# Patient Record
Sex: Male | Born: 2000 | Hispanic: Yes | Marital: Single | Smoking: Never smoker
Health system: Southern US, Community
[De-identification: ages and names within clinical notes are randomized; demographics above are authoritative.]

## PROBLEM LIST (undated history)

## (undated) DIAGNOSIS — F909 Attention-deficit hyperactivity disorder, unspecified type: Secondary | ICD-10-CM

## (undated) DIAGNOSIS — Z789 Other specified health status: Secondary | ICD-10-CM

## (undated) DIAGNOSIS — F39 Unspecified mood [affective] disorder: Secondary | ICD-10-CM

---
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
## 2020-02-04 ENCOUNTER — Emergency Department
Admission: EM | Admit: 2020-02-04 | Discharge: 2020-02-05 | Disposition: A | Discharge status: Home / Self Care | Payer: Medicaid Other | Attending: Emergency Medicine | Admitting: Emergency Medicine

## 2020-02-04 ENCOUNTER — Other Ambulatory Visit: Payer: Self-pay

## 2020-02-04 ENCOUNTER — Encounter: Payer: Self-pay | Admitting: Emergency Medicine

## 2020-02-04 DIAGNOSIS — F39 Unspecified mood [affective] disorder: Secondary | ICD-10-CM | POA: Diagnosis not present

## 2020-02-04 DIAGNOSIS — Z20822 Contact with and (suspected) exposure to covid-19: Secondary | ICD-10-CM | POA: Insufficient documentation

## 2020-02-04 DIAGNOSIS — F4325 Adjustment disorder with mixed disturbance of emotions and conduct: Secondary | ICD-10-CM | POA: Diagnosis not present

## 2020-02-04 DIAGNOSIS — F909 Attention-deficit hyperactivity disorder, unspecified type: Secondary | ICD-10-CM | POA: Insufficient documentation

## 2020-02-04 DIAGNOSIS — R45851 Suicidal ideations: Secondary | ICD-10-CM | POA: Insufficient documentation

## 2020-02-04 DIAGNOSIS — Z046 Encounter for general psychiatric examination, requested by authority: Secondary | ICD-10-CM | POA: Diagnosis not present

## 2020-02-04 LAB — CBC
HCT: 45.9 % (ref 39.0–52.0)
Hemoglobin: 16.2 g/dL (ref 13.0–17.0)
MCH: 30.7 pg (ref 26.0–34.0)
MCHC: 35.3 g/dL (ref 30.0–36.0)
MCV: 87.1 fL (ref 80.0–100.0)
Platelets: 300 10*3/uL (ref 150–400)
RBC: 5.27 MIL/uL (ref 4.22–5.81)
RDW: 13.7 % (ref 11.5–15.5)
WBC: 8.3 10*3/uL (ref 4.0–10.5)
nRBC: 0.2 % (ref 0.0–0.2)

## 2020-02-04 LAB — COMPREHENSIVE METABOLIC PANEL
ALT: 31 U/L (ref 0–44)
AST: 34 U/L (ref 15–41)
Albumin: 5 g/dL (ref 3.5–5.0)
Alkaline Phosphatase: 102 U/L (ref 38–126)
Anion gap: 12 (ref 5–15)
BUN: 14 mg/dL (ref 6–20)
CO2: 21 mmol/L — ABNORMAL LOW (ref 22–32)
Calcium: 10 mg/dL (ref 8.9–10.3)
Chloride: 106 mmol/L (ref 98–111)
Creatinine, Ser: 0.81 mg/dL (ref 0.61–1.24)
GFR, Estimated: >60 mL/min (ref >60)
Glucose, Bld: 79 mg/dL (ref 70–99)
Potassium: 5 mmol/L (ref 3.5–5.1)
Sodium: 139 mmol/L (ref 135–145)
Total Bilirubin: 2.8 mg/dL — ABNORMAL HIGH (ref 0.3–1.2)
Total Protein: 8.2 g/dL — ABNORMAL HIGH (ref 6.5–8.1)

## 2020-02-04 LAB — URINE DRUG SCREEN, QUALITATIVE (ARMC ONLY)
Amphetamines, Ur Screen: NOT DETECTED
Barbiturates, Ur Screen: NOT DETECTED
Benzodiazepine, Ur Scrn: NOT DETECTED
Cannabinoid 50 Ng, Ur ~~LOC~~: NOT DETECTED
Cocaine Metabolite,Ur ~~LOC~~: NOT DETECTED
MDMA (Ecstasy)Ur Screen: NOT DETECTED
Methadone Scn, Ur: NOT DETECTED
Opiate, Ur Screen: NOT DETECTED
Phencyclidine (PCP) Ur S: NOT DETECTED
Tricyclic, Ur Screen: NOT DETECTED

## 2020-02-04 LAB — ETHANOL: Alcohol, Ethyl (B): <10 mg/dL (ref <10)

## 2020-02-04 LAB — RESPIRATORY PANEL BY RT PCR (FLU A&B, COVID)
Influenza A by PCR: NEGATIVE
Influenza B by PCR: NEGATIVE
SARS Coronavirus 2 by RT PCR: NEGATIVE

## 2020-02-04 LAB — SALICYLATE LEVEL: Salicylate Lvl: <7 mg/dL — ABNORMAL LOW (ref 7.0–30.0)

## 2020-02-04 LAB — ACETAMINOPHEN LEVEL: Acetaminophen (Tylenol), Serum: <10 ug/mL — ABNORMAL LOW (ref 10–30)

## 2020-02-04 NOTE — BH Assessment (Addendum)
Assessment Note  Joshua Weeks is an 19 y.o. male who presents to the ER, via law enforcement, after he was seen at [facility]). Patient states he went to the police station with the intentions of getting arrested, so he can go to jail. "I had a bag of drugs and a knife with me." He further explained, he want "to get locked up so I can get tough or die hard trying." When asked how long he has felt this way and what lead to him wanting to do this?, He was unable to give a time frame but says, "I'm tired of been weak." Per the report of [facility] ([phone]), the patient reported having the plan of ending his life by overdosing on medication, cutting his wrist and or jumping off a bridge. He shared he has had one suicide attempt in the past and it resulted in him getting admitted to the medical floor and when stable, transferred to behavioral medicine.  During the interview, the patient was calm, cooperative and pleasant. He was able to provide appropriate answers to the questions. He denies history of violence and aggression. He also denies involvement with the legal system. He denies the use of mind-altering substances and his labs reflect the same.  Diagnosis: Major Depression  Past Medical History:  Past Medical History:  Diagnosis Date  . ADHD   . Medical history non-contributory   . Mood disorder (HCC)     History reviewed. No pertinent surgical history.  Family History: History reviewed. No pertinent family history.  Social History:  reports that he has never smoked. He has never used smokeless tobacco. He reports that he does not drink alcohol and does not use drugs.  Additional Social History:  Alcohol / Drug Use Pain Medications: See PTA Prescriptions: See PTA Over the Counter: See PTA History of alcohol / drug use?: No history of alcohol / drug abuse Longest period of sobriety (when/how long): None  CIWA: CIWA-Ar BP: 129/80 Pulse Rate: 61 COWS:     Allergies:  Allergies  Allergen Reactions  . Corn Oil Rash  . Lac Bovis Rash  . Soy Allergy Hives  . Wheat Bran Rash    Home Medications: (Not in a hospital admission)   OB/GYN Status:  No LMP for male patient.  General Assessment Data Location of Assessment: [facility] ED TTS Assessment: In system Is this a Tele or Face-to-Face Assessment?: Face-to-Face Is this an Initial Assessment or a Re-assessment for this encounter?: Initial Assessment Patient Accompanied by:: N/A Language Other than English: No Living Arrangements: Other (Comment) (Private Home) What gender do you identify as?: Male Date Telepsych consult ordered in [facility]: 02/04/20 Time Telepsych consult ordered in [facility]: 1642 Marital status: Single Pregnancy Status: No Living Arrangements: Non-relatives/Friends Can pt return to current living arrangement?: Yes Admission Status: Involuntary Petitioner: ED Attending Is patient capable of signing voluntary admission?: No (Under IVC) Referral Source: Self/Family/Friend Insurance type: Medicaid  Medical Screening Exam [facility] Walk-in ONLY) Medical Exam completed: Yes  Crisis Care Plan Living Arrangements: Non-relatives/Friends Legal Guardian: Other: (Self) Name of Psychiatrist: Reports of none Name of Therapist: Reports of none  Education Status Is patient currently in school?: No Is the patient employed, unemployed or receiving disability?: Unemployed  Risk to self with the past 6 months Suicidal Ideation: Yes-Currently Present Has patient been a risk to self within the past 6 months prior to admission? : Yes Suicidal Intent: No Has patient had any suicidal intent within the past 6 months  prior to admission? : No Is patient at risk for suicide?: Yes Suicidal Plan?: Yes-Currently Present Has patient had any suicidal plan within the past 6 months prior to admission? : Yes Specify Current Suicidal Plan: Overdose, cut wrist & jump off a bridge Access to Means:  Yes Specify Access to Suicidal Means: Medications, sharp objects and bridges What has been your use of drugs/alcohol within the last 12 months?: reports of none Previous Attempts/Gestures: Yes How many times?: 1 Other Self Harm Risks: Reports of none Triggers for Past Attempts: None known Intentional Self Injurious Behavior: None Comment - Self Injurious Behavior: Reports of none Family Suicide History: Unknown Recent stressful life event(s): Other (Comment) Persecutory voices/beliefs?: No Depression: Yes Depression Symptoms: Isolating, Feeling worthless/self pity, Loss of interest in usual pleasures Substance abuse history and/or treatment for substance abuse?: No Suicide prevention information given to non-admitted patients: Not applicable  Risk to Others within the past 6 months Homicidal Ideation: No Does patient have any lifetime risk of violence toward others beyond the six months prior to admission? : No Thoughts of Harm to Others: No Current Homicidal Intent: No Current Homicidal Plan: No Access to Homicidal Means: No Identified Victim: Reports of none History of harm to others?: No Assessment of Violence: None Noted Violent Behavior Description: Reports of none Does patient have access to weapons?: No Criminal Charges Pending?: No Does patient have a court date: No Is patient on probation?: No  Psychosis Hallucinations: Auditory, Visual Delusions: None noted  Mental Status Report Appearance/Hygiene: Unremarkable, In scrubs Eye Contact: Good Motor Activity: Freedom of movement, Unremarkable Speech: Logical/coherent, Unremarkable Level of Consciousness: Alert Mood: Pleasant, Sad, Depressed Affect: Sad, Depressed Anxiety Level: Minimal Thought Processes: Coherent, Relevant Judgement: Unimpaired Orientation: Person, Place, Time, Situation, Appropriate for developmental age Obsessive Compulsive Thoughts/Behaviors: Minimal  Cognitive Functioning Concentration:  Normal Memory: Recent Intact, Remote Intact Is patient IDD: No Insight: Fair Impulse Control: Fair Appetite: Good Have you had any weight changes? : No Change Sleep: No Change Total Hours of Sleep: 7 Vegetative Symptoms: None  ADLScreening [facility] Assessment Services) Patient's cognitive ability adequate to safely complete daily activities?: Yes Patient able to express need for assistance with ADLs?: Yes Independently performs ADLs?: Yes (appropriate for developmental age)  Prior Inpatient Therapy Prior Inpatient Therapy: Yes Prior Therapy Dates: 09/2019 & 02/2018 Prior Therapy Facilty/Provider(s): [facility] Reason for Treatment: Major Depression  Prior Outpatient Therapy Prior Outpatient Therapy: No Does patient have an ACCT team?: No Does patient have Intensive In-House Services?  : No Does patient have [facility] services? : No Does patient have P4CC services?: No  ADL Screening (condition at time of admission) Patient's cognitive ability adequate to safely complete daily activities?: Yes Is the patient deaf or have difficulty hearing?: No Does the patient have difficulty seeing, even when wearing glasses/contacts?: No Does the patient have difficulty concentrating, remembering, or making decisions?: No Patient able to express need for assistance with ADLs?: Yes Does the patient have difficulty dressing or bathing?: No Independently performs ADLs?: Yes (appropriate for developmental age) Does the patient have difficulty walking or climbing stairs?: No Weakness of Legs: None Weakness of Arms/Hands: None  Home Assistive Devices/Equipment Home Assistive Devices/Equipment: None  Therapy Consults (therapy consults require a physician order) PT Evaluation Needed: No OT Evalulation Needed: No SLP Evaluation Needed: No Abuse/Neglect Assessment (Assessment to be complete while patient is alone) Abuse/Neglect Assessment Can Be Completed: Yes Physical Abuse: Denies Verbal Abuse:  Denies Sexual Abuse: Denies Exploitation of patient/patient's resources: Denies Self-Neglect: Denies  Values / Beliefs Cultural Requests During Hospitalization: None Spiritual Requests During Hospitalization: None Consults Spiritual Care Consult Needed: No Transition of Care Team Consult Needed: No Advance Directives (For Healthcare) Does Patient Have a Medical Advance Directive?: No Would patient like information on creating a medical advance directive?: No - Patient declined  Disposition:  Disposition Initial Assessment Completed for this Encounter: Yes  On Site Evaluation by:   Reviewed with Physician:    Lilyan Gilford MS, LCAS, [facility], NCC Therapeutic Triage Specialist 02/04/2020 6:32 PM

## 2020-02-04 NOTE — ED Provider Notes (Signed)
[facility] Emergency Department Provider Note   ____________________________________________   First MD Initiated Contact with Patient 02/04/20 1616     (approximate)  I have reviewed the triage vital signs and the nursing notes.   HISTORY  Chief Complaint Suicidal Ideation   HPI Joshua Weeks is a 19 y.o. male with possible history of ADHD and mood disorder who presents to the ED for suicidal ideation.  Patient initially presented to the police station, where he stated that he wanted to go to jail in order to toughen himself up.  He states that he does not know how he ended up here because his intention is still to end up in jail.  He states he will go to jail "or I will die trying."  He does endorse suicidal ideation, states he wants to end his life but does not specify how he is thought about doing it.  He denies any alcohol or drug abuse, does not have any medical complaints at this time.        Past Medical History:  Diagnosis Date  . ADHD   . Medical history non-contributory   . Mood disorder [facility])     Patient Active Problem List   Diagnosis Date Noted  . Attention deficit hyperactivity disorder (ADHD), combined type   . DMDD (disruptive mood dysregulation disorder) (HCC) 02/25/2018  . 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 Suicide attempt (HCC) 02/25/2018    History reviewed. No pertinent surgical history.  Prior to Admission medications   Not on File    Allergies Corn oil, Lac bovis, Soy allergy, and Wheat bran  History reviewed. No pertinent family history.  Social History Social History   Tobacco Use  . Smoking status: Never Smoker  . Smokeless tobacco: Never Used  Substance Use Topics  . Alcohol use: No  . Drug use: No    Review of Systems  Constitutional: No fever/chills Eyes: No visual changes. ENT: No sore throat. Cardiovascular: Denies chest pain. Respiratory: Denies shortness of breath. Gastrointestinal:  No abdominal pain.  No nausea, no vomiting.  No diarrhea.  No constipation. Genitourinary: Negative for dysuria. Musculoskeletal: Negative for back pain. Skin: Negative for rash. Neurological: Negative for headaches, focal weakness or numbness.  Positive for suicidal ideation.  ____________________________________________   PHYSICAL EXAM:  VITAL SIGNS: ED Triage Vitals [02/04/20 1542]  Enc Vitals Group     BP 129/80     Pulse Rate 61     Resp 18     Temp 97.7 F (36.5 C)     Temp Source Oral     SpO2 100 %     Weight 225 lb (102.1 kg)     Height 5\' 10"  (1.778 m)     Head Circumference      Peak Flow      Pain Score 0     Pain Loc      Pain Edu?      Excl. in GC?     Constitutional: Alert and oriented. Eyes: Conjunctivae are normal. Head: Atraumatic. Nose: No congestion/rhinnorhea. Mouth/Throat: Mucous membranes are moist. Neck: Normal ROM Cardiovascular: Normal rate, regular rhythm. Grossly normal heart sounds. Respiratory: Normal respiratory effort.  No retractions. Lungs CTAB. Gastrointestinal: Soft and nontender. No distention. Genitourinary: deferred Musculoskeletal: No lower extremity tenderness nor edema. Neurologic:  Normal speech and language. No gross focal neurologic deficits are appreciated. Skin:  Skin is warm, dry and intact. No rash noted. Psychiatric: Mood and affect are normal. Speech and behavior  are normal.  ____________________________________________   LABS (all labs ordered are listed, but only abnormal results are displayed)  Labs Reviewed  COMPREHENSIVE METABOLIC PANEL - Abnormal; Notable for the following components:      Result Value   CO2 21 (*)    Total Protein 8.2 (*)    Total Bilirubin 2.8 (*)    All other components within normal limits  RESPIRATORY PANEL BY RT PCR (FLU A&B, COVID)  CBC  URINE DRUG SCREEN, QUALITATIVE ([facility] ONLY)  ETHANOL  SALICYLATE LEVEL  ACETAMINOPHEN LEVEL    PROCEDURES  Procedure(s) performed  (including Critical Care):  Procedures   ____________________________________________   INITIAL IMPRESSION / ASSESSMENT AND PLAN / ED COURSE       19 year old male with possible history of ADHD and mood disorder who presents to the ED with suicidal ideation, but does not voice any specific plan.  He does have a history of suicidal attempt and is currently under IVC.  He denies any medical complaints and screening labs are unremarkable.  Patient is medically cleared for psychiatric evaluation.      ____________________________________________   FINAL CLINICAL IMPRESSION(S) / ED DIAGNOSES  Final diagnoses:  Suicidal ideation     ED Discharge Orders    None       Note:  This document was prepared using Dragon voice recognition software and may include unintentional dictation errors.   Chesley Noon, MD 02/04/20 [phone]

## 2020-02-04 NOTE — ED Notes (Signed)
Hourly rounding reveals patient in room. No complaints, stable, in no acute distress. Q15 minute rounds and monitoring via Security Cameras to continue. 

## 2020-02-04 NOTE — ED Notes (Signed)
Report to include Situation, Background, Assessment, and Recommendations received from [facility]. Patient alert and oriented, warm and dry, in no acute distress. Patient reported SI without a plan. Denied HI, AVH and pain. Patient made aware of Q15 minute rounds and security cameras for their safety. Patient instructed to come to me with needs or concerns.

## 2020-02-04 NOTE — ED Notes (Signed)
Pt changed into burgundy scrubs °

## 2020-02-04 NOTE — ED Notes (Signed)
Patient transferred via w/c to [facility], room 2, He is calm and cooperative.

## 2020-02-04 NOTE — ED Triage Notes (Addendum)
Pt to ED via [facility] fom [facility]. Pt st "my intention was to go to jail today. I turned in some drugs to the police". Pt st he wanted to end up in jail because he "I wanted to get tougher".   St "I always have suicidal thoughts like jumping off a bridge or jumping into traffic". "best way to go out is get tougher or die trying". Pt st attempting to kill himself by  using a fire extinguisher in 2019 by "spraying it in my mouth" "wanted to die".   Pt st feeling "numb" since 2 weeks ago.   Pt st he is from [place] and recently moved. Lives with friends. Per pt, does not have family.   Denies hallucinations ideation. Auditory hallucinations.   Pt st suicidal attempt in 2019 and admitted to "[facility]".  Pt cooperative, speaking in complete and clear sentences. [facility] in room during triage.

## 2020-02-04 NOTE — ED Notes (Signed)
Snack and beverage given. 

## 2020-02-05 DIAGNOSIS — F4325 Adjustment disorder with mixed disturbance of emotions and conduct: Secondary | ICD-10-CM | POA: Diagnosis present

## 2020-02-05 NOTE — ED Provider Notes (Signed)
Emergency Medicine Observation Re-evaluation Note  Joshua Weeks is a 19 y.o. male, seen on rounds today.  Pt initially presented to the ED for complaints of No chief complaint on file. Currently, the patient is resting comfortably, easily alert, calm and pleasant in his conversation.  Physical Exam  BP 100/62 (BP Location: Left Arm)   Pulse 65   Temp 97.8 F (36.6 C) (Oral)   Resp 18   Ht 5\' 10"  (1.778 m)   Wt 102.1 kg   SpO2 96%   BMI 32.28 kg/m  Physical Exam General: Alert and oriented Cardiac: Normal perfusion, well perfused warm pink skin Lungs: No distress, resting without difficulty speaking full clear sentences Psych: He is calm and pleasant at this time  ED Course / MDM  EKG:    I have reviewed the labs performed to date as well as medications administered while in observation.  Recent changes in the last 24 hours include ongoing observation in the ER, he is calm and appropriate this morning, he is awaiting evaluation by our psychiatry team.  Plan  Current plan is for awaiting psychiatric consult. Patient is under full IVC at this time.   , MD 02/05/20 [phone]

## 2020-02-05 NOTE — ED Provider Notes (Signed)
Case discussed with Nanine Means of psychiatry service.  Patient's IVC has been rescinded and recommendation for discharge.  Patient reports plan to go to homeless shelter[facility].   Sharyn Creamer, MD 02/05/20 1327

## 2020-02-05 NOTE — ED Notes (Signed)
Hourly rounding reveals patient in room. No complaints, stable, in no acute distress. Q15 minute rounds and monitoring via Security Cameras to continue. 

## 2020-02-05 NOTE — Discharge Instructions (Addendum)
[facility] [place] Address: [address] [place], [place], [place] [address] Areas served: [employer] Hours:  Open 24 hours Phone: [phone] Appointments: [email or web address]  Suicidal Feelings: How to Help Yourself Suicide is when you end your own life. There are many things you can do to help yourself feel better when struggling with these feelings. Many services and people are available to support you and others who struggle with similar feelings.  If you ever feel like you may hurt yourself or others, or have thoughts about taking your own life, get help right away. To get help:  Call your local emergency services (911 in the [place]  The [place] [address]'s health and human services helpline (211 in the [place]  Go to your nearest emergency department.  Call a suicide hotline to speak with a trained counselor. The following suicide hotlines are available in the [place]: ? 1-800-273-TALK 3086638480). ? 1-800-SUICIDE 516-604-0363). ? 559-514-9218. This is a hotline for Spanish speakers. ? 629 608 5710. This is a hotline for TTY users. ? 1-866-4-U-TREVOR 902-234-1617). This is a hotline for lesbian, gay, bisexual, transgender, or questioning youth. ? For a list of hotlines in [place], visit ItCheaper.dk.html  Contact a crisis center or a local suicide prevention center. To find a crisis center or suicide prevention center: ? Call your local hospital, clinic, community service organization, mental health center, social service provider, or health department. Ask for help with connecting to a crisis center. ? For a list of crisis centers in the [place], visit: suicidepreventionlifeline.org ? For a list of crisis centers in [place], visit: suicideprevention.ca How to help yourself feel better   Promise yourself that you will not do anything extreme when you have  suicidal feelings. Remember, there is hope. Many people have gotten through suicidal thoughts and feelings, and you can too. If you have had these feelings before, remind yourself that you can get through them again.  Let family, friends, teachers, or counselors know how you are feeling. Try not to separate yourself from those who care about you and want to help you. Talk with someone every day, even if you do not feel sociable. Face-to-face conversation is best to help them understand your feelings.  Contact a mental health care provider and work with this person regularly.  Make a safety plan that you can follow during a crisis. Include phone numbers of suicide prevention hotlines, mental health professionals, and trusted friends and family members you can call during an emergency. Save these numbers on your phone.  If you are thinking of taking a lot of medicine, give your medicine to someone who can give it to you as prescribed. If you are on antidepressants and are concerned you will overdose, tell your health care provider so that he or she can give you safer medicines.  Try to stick to your routines. Follow a schedule every day. Make self-care a priority.  Make a list of realistic goals, and cross them off when you achieve them. Accomplishments can give you a sense of worth.  Wait until you are feeling better before doing things that you find difficult or unpleasant.  Do things that you have always enjoyed to take your mind off your feelings. Try reading a book, or listening to or playing music. Spending time outside, in nature, may help you feel better. Follow these instructions at home:   Visit your primary health care provider every year for a checkup.  Work with a mental  health care provider as needed.  Eat a well-balanced diet, and eat regular meals.  Get plenty of rest.  Exercise if you are able. Just 30 minutes of exercise each day can help you feel better.  Take  over-the-counter and prescription medicines only as told by your health care provider. Ask your mental health care provider about the possible side effects of any medicines you are taking.  Do not use alcohol or drugs, and remove these substances from your home.  Remove weapons, poisons, knives, and other deadly items from your home. General recommendations  Keep your living space well lit.  When you are feeling well, write yourself a letter with tips and support that you can read when you are not feeling well.  Remember that life's difficulties can be sorted out with help. Conditions can be treated, and you can learn behaviors and ways of thinking that will help you. Where to find more information  National Suicide Prevention Lifeline: www.suicidepreventionlifeline.org  Hopeline: www.hopeline.com  [employer] for Suicide Prevention: https://www.ayers.com/  [employer] (for lesbian, gay, bisexual, transgender, or questioning youth): www.thetrevorproject.org Contact a health care provider if:  You feel as though you are a burden to others.  You feel agitated, angry, vengeful, or have extreme mood swings.  You have withdrawn from family and friends. Get help right away if:  You are talking about suicide or wishing to die.  You start making plans for how to commit suicide.  You feel that you have no reason to live.  You start making plans for putting your affairs in order, saying goodbye, or giving your possessions away.  You feel guilt, shame, or unbearable pain, and it seems like there is no way out.  You are frequently using drugs or alcohol.  You are engaging in risky behaviors that could lead to death. If you have any of these symptoms, get help right away. Call emergency services, go to your nearest emergency department or crisis center, or call a suicide crisis helpline. Summary  Suicide is when you take your own life.  Promise yourself that you will not do  anything extreme when you have suicidal feelings.  Let family, friends, teachers, or counselors know how you are feeling.  Get help right away if you feel as though life is getting too tough to handle and you are thinking about suicide. This information is not intended to replace advice given to you by your health care provider. Make sure you discuss any questions you have with your health care provider. Document Revised: 07/15/2018 Document Reviewed: 11/04/2016 Elsevier Patient Education  2020 [employer].

## 2020-02-05 NOTE — BH Assessment (Addendum)
[facility] not accepting pts at this time as of 02/04/20. Follow up needed.  ° °Per [facility] Joann, there is no bed availability due to staffing issues. Joann advised TTS follow up with morning AC to check the status of beds throughout the day as of 02/05/20.  °  °Referral information for Psychiatric Hospitalization faxed to;  °  °· [facility] ([phone]-or- [phone]),  °  °· [facility] (-[phone] -or- [phone]) [phone] °  °· [facility] ([phone]---[phone]---[phone]), °  °· [facility] ([phone], [phone], [phone] or [phone]),  °  °· [facility] ([phone] or [phone]) °  °· [facility] ([phone]),  °  °· [facility] ([phone]). ° °• [facility] ([phone] -or- [phone]),  ° ° °

## 2020-02-05 NOTE — Consult Note (Signed)
[facility] Psych ED Discharge  02/05/2020 12:19 PM Joshua Weeks  MRN:  [id number] Principal Problem: Adjustment disorder with mixed disturbance of emotions and conduct Discharge Diagnoses: Principal Problem:   Adjustment disorder with mixed disturbance of emotions and conduct  Subjective: Patient seen in person by this provider. Patient admitted to the ED after going to the police station with the intent of getting arrested so he can "get tough or die hard trying." Today patient is alert, calm and cooperate. Denies any suicidal ideation or homicidal ideation. Denies audio/visual hallucinations. Patients states that he is "better" and just needed to get some sleep. Patient reports that he has not slept in five days. Refuses medications to help with sleep at this time. Patient states he is ready to leave and will be going to [facility] after discharge. Recommended client follow up with [facility] if he stays in this area or [facility] if moving to [facility].  He has been to the [employer] in the past.  Total Time spent with patient: 30 minutes  Past Psychiatric History: Mood disorder and ADHD  Past Medical History:  Past Medical History:  Diagnosis Date  . ADHD   . Medical history non-contributory   . Mood disorder (HCC)    History reviewed. No pertinent surgical history. Family History: History reviewed. No pertinent family history. Family Psychiatric  History: none Social History:  Social History   Substance and Sexual Activity  Alcohol Use No     Social History   Substance and Sexual Activity  Drug Use No    Social History   Socioeconomic History  . Marital status: Single    Spouse name: Not on file  . Number of children: Not on file  . Years of education: Not on file  . Highest education level: GED or equivalent  Occupational History  . Not on file  Tobacco Use  . Smoking status: Never Smoker  . Smokeless tobacco: Never Used  Substance and Sexual Activity  .  Alcohol use: No  . Drug use: No  . Sexual activity: Not Currently  Other Topics Concern  . Not on file  Social History Narrative   Pt moved to [place] from [place] two days ago. Pt staying at [facility] residence. Pt biological parents killed in MVA a few weeks ago. Relationship with adoptive parents estranged. Pt cannot return to [facility] residence for two weeks d/t telling them he is Covid-positive (test results show he is Covid negative).   Social Determinants of Health   Financial Resource Strain:   . Difficulty of Paying Living Expenses: Not on file  Food Insecurity:   . Worried About Programme researcher, broadcasting/film/video in the Last Year: Not on file  . Ran Out of Food in the Last Year: Not on file  Transportation Needs:   . Lack of Transportation (Medical): Not on file  . Lack of Transportation (Non-Medical): Not on file  Physical Activity:   . Days of Exercise per Week: Not on file  . Minutes of Exercise per Session: Not on file  Stress:   . Feeling of Stress : Not on file  Social Connections:   . Frequency of Communication with Friends and Family: Not on file  . Frequency of Social Gatherings with Friends and Family: Not on file  . Attends Religious Services: Not on file  . Active Member of Clubs or Organizations: Not on file  . Attends Banker Meetings: Not on file  . Marital Status: Not on  file    Has this patient used any form of tobacco in the last 30 days? (Cigarettes, Smokeless Tobacco, Cigars, and/or Pipes) A prescription for an FDA-approved tobacco cessation medication was offered at discharge and the patient refused  Current Medications: No current facility-administered medications for this encounter.   No current outpatient medications on file.   PTA Medications: (Not in a hospital admission)   Musculoskeletal: Strength & Muscle Tone: within normal limits Gait & Station: normal Patient leans: N/A  Psychiatric Specialty Exam: Physical Exam Vitals and nursing note reviewed.   Constitutional:      Appearance: Normal appearance.  HENT:     Head: Normocephalic.     Nose: Nose normal.  Pulmonary:     Effort: Pulmonary effort is normal.  Musculoskeletal:        General: Normal range of motion.     Cervical back: Normal range of motion.  Neurological:     General: No focal deficit present.     Mental Status: He is alert and oriented to person, place, and time.  Psychiatric:        Attention and Perception: Attention normal. He is attentive. He does not perceive auditory or visual hallucinations.        Mood and Affect: Mood is depressed. Affect is flat.        Speech: Speech normal.        Behavior: Behavior normal. Behavior is cooperative.        Thought Content: Thought content is not paranoid or delusional. Thought content does not include homicidal or suicidal ideation. Thought content does not include homicidal or suicidal plan.        Cognition and Memory: Cognition and memory normal.     Review of Systems  Psychiatric/Behavioral: The patient is nervous/anxious.   All other systems reviewed and are negative.   Blood pressure 114/74, pulse 85, temperature 98 F (36.7 C), temperature source Oral, resp. rate 16, height 5\' 10"  (1.778 m), weight 102.1 kg, SpO2 97 %.Body mass index is 32.28 kg/m.  General Appearance: Casual and Guarded  Eye Contact:  Fair  Speech:  Normal Rate  Volume:  Normal  Mood:  Depressed  Affect:  Congruent and Flat  Thought Process:  Coherent, Goal Directed and Linear  Orientation:  Full (Time, Place, and Person)  Thought Content:  WDL and Logical  Suicidal Thoughts:  No  Homicidal Thoughts:  No  Memory:  Immediate;   Fair Recent;   Fair Remote;   Fair  Judgement:  Fair  Insight:  Fair  Psychomotor Activity:  Normal  Concentration:  Concentration: Good and Attention Span: Good  Recall:  Good  Fund of Knowledge:  Fair  Language:  Fair  Akathisia:  No  Handed:  Right  AIMS (if indicated):     Assets:  Communication  Skills Desire for Improvement  ADL's:  Intact  Cognition:  WNL  Sleep:        Demographic Factors:  Male, Adolescent or young adult, Low socioeconomic status and Unemployed  Loss Factors: Financial problems/change in socioeconomic status  Historical Factors: Prior suicide attempts  Risk Reduction Factors:   Positive coping skills or problem solving skills  Continued Clinical Symptoms:  Depression:   Hopelessness Previous Psychiatric Diagnoses and Treatments  Cognitive Features That Contribute To Risk:  None    Suicide Risk:  Minimal: No identifiable suicidal ideation.  Patients presenting with no risk factors but with morbid ruminations; may be classified as minimal risk based on the  severity of the depressive symptoms    Plan Of Care/Follow-up recommendations:  Activity:  as tolerated Diet:  as tolerated  Disposition:   Adjustment disorder with mixed disturbance of emotions and conduct  -- Patient refuses all medications and resources -Recommended [employer] or [facility] Plans to follow up with [employer], NP 02/05/2020, 12:19 PM

## 2020-02-05 NOTE — ED Notes (Signed)
Pt discharged home.  VS stable. All belongings returned to patient.   Discharge instructions reviewed with patient. Pt denies SI.  

## 2020-08-27 IMAGING — CR DG CHEST 2V
2 series · 2 of 2 positions shown · non-contrast
Comparison: None.

CLINICAL DATA: 16 y/o M; inhaled fire extinguisher, shortness of
breath.

EXAM:
CHEST - 2 VIEW

[chest pa]
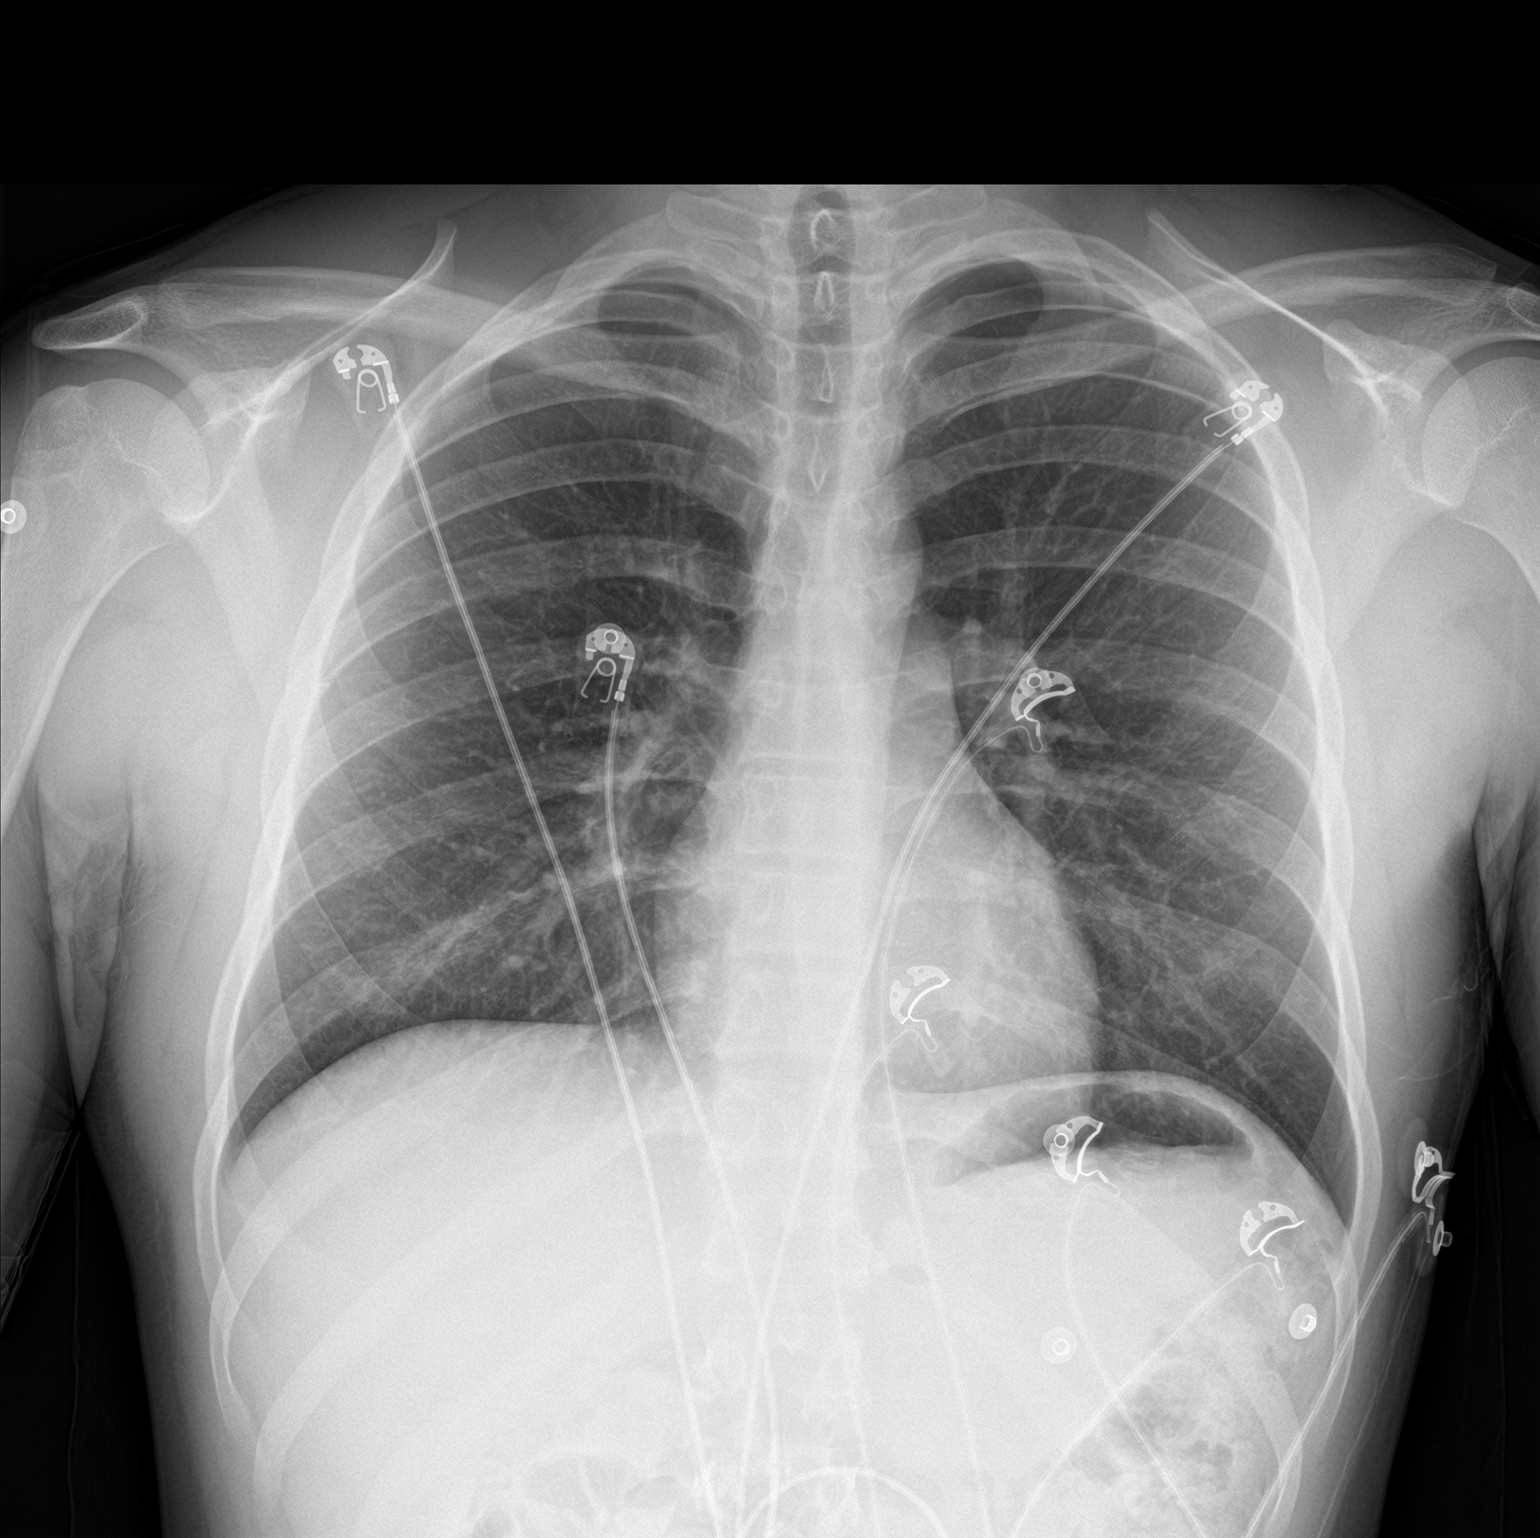

[chest lat]
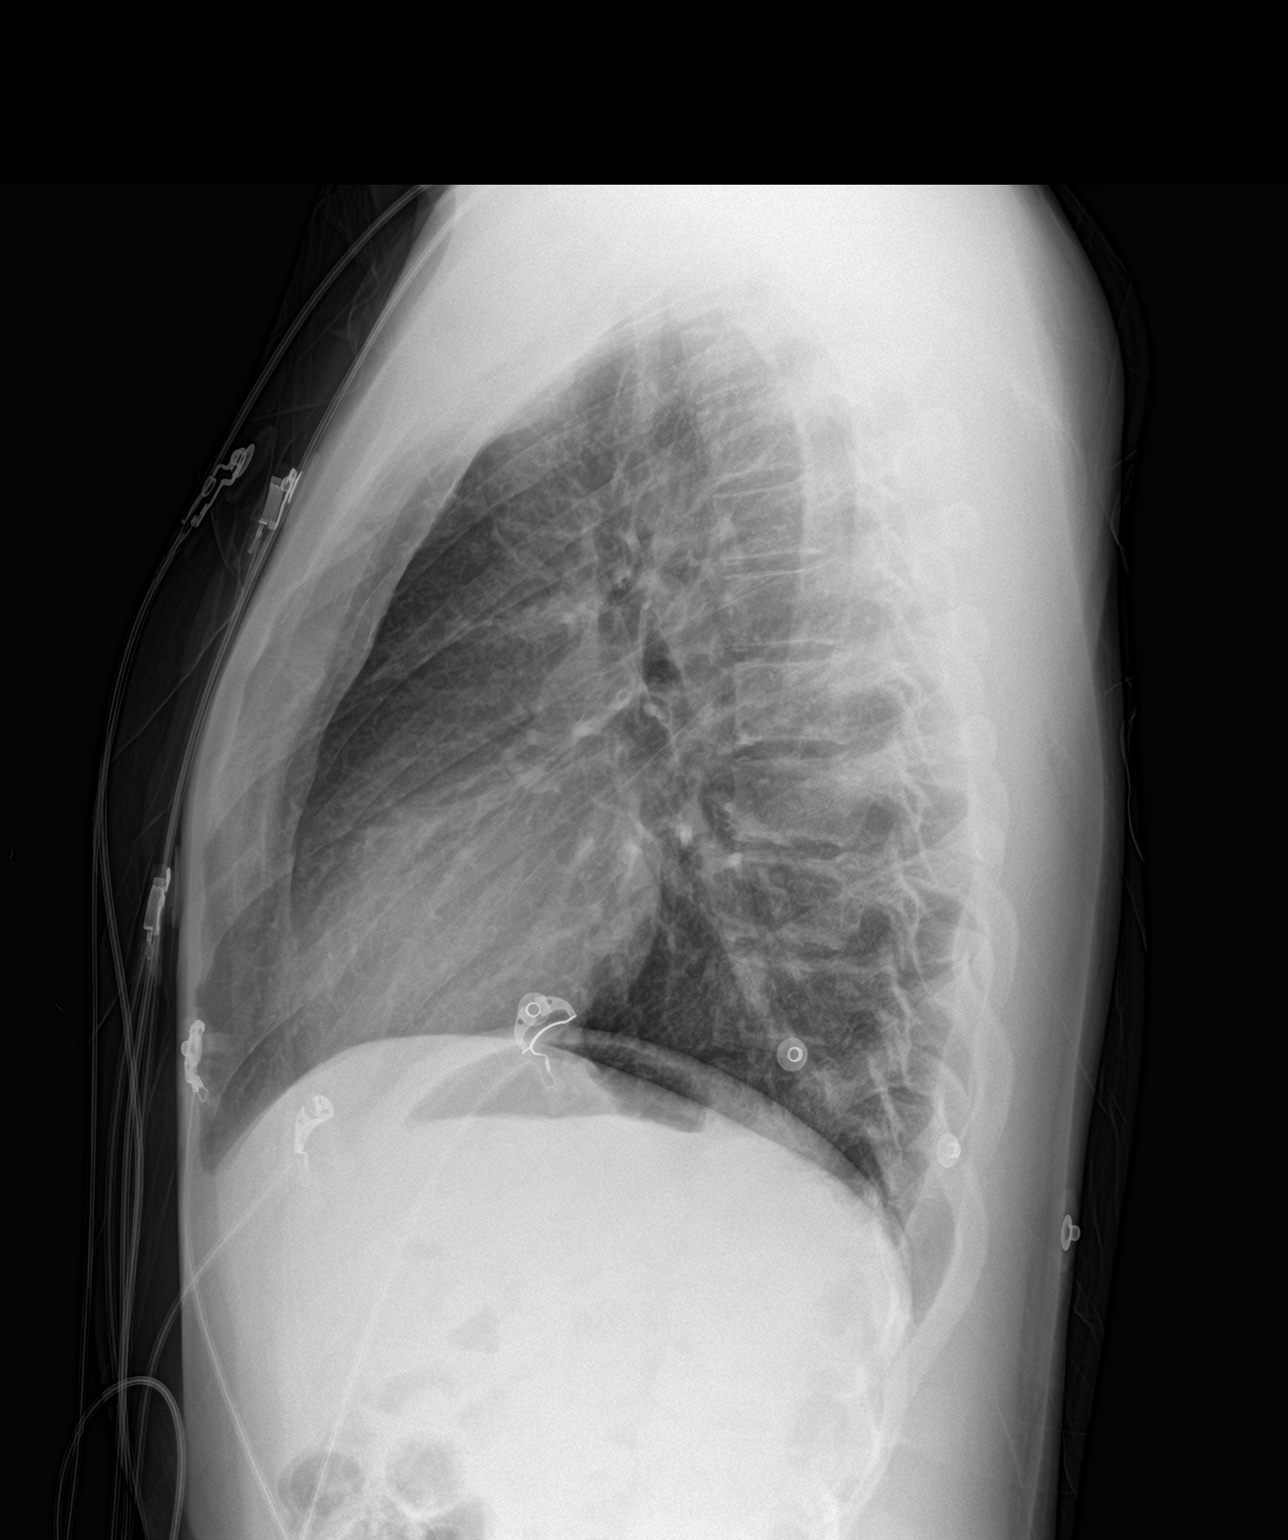

[2 of 2 positions shown; findings below may reference images not displayed]

FINDINGS: The heart size and mediastinal contours are within normal limits.
Both lungs are clear. The visualized skeletal structures are
unremarkable.
IMPRESSION: No acute pulmonary process identified.
